# Patient Record
Sex: Female | Born: 2000 | Hispanic: No | Marital: Single | Smoking: Never smoker
Health system: Southern US, Community
[De-identification: ages and names within clinical notes are randomized; demographics above are authoritative.]

## PROBLEM LIST (undated history)

## (undated) HISTORY — PX: WISDOM TOOTH EXTRACTION: SHX21

## (undated) HISTORY — PX: ANTERIOR CRUCIATE LIGAMENT REPAIR: SHX115

---

## 2018-10-11 ENCOUNTER — Other Ambulatory Visit: Payer: Self-pay

## 2018-10-11 ENCOUNTER — Emergency Department (HOSPITAL_COMMUNITY)
Admission: EM | Admit: 2018-10-11 | Discharge: 2018-10-12 | Disposition: A | Discharge status: Home / Self Care | Payer: BC Managed Care – PPO | Attending: Emergency Medicine | Admitting: Emergency Medicine

## 2018-10-11 DIAGNOSIS — M546 Pain in thoracic spine: Secondary | ICD-10-CM | POA: Insufficient documentation

## 2018-10-11 DIAGNOSIS — M7918 Myalgia, other site: Secondary | ICD-10-CM

## 2018-10-12 ENCOUNTER — Other Ambulatory Visit: Payer: Self-pay

## 2018-10-12 MED ORDER — IBUPROFEN 400 MG PO TABS
600.0000 mg | ORAL_TABLET | Freq: Once | ORAL | Status: AC
  Administered 2018-10-12: 600 mg via ORAL
  Filled 2018-10-12: qty 1

## 2018-10-12 MED ORDER — CYCLOBENZAPRINE HCL 10 MG PO TABS: 10.0000 mg | ORAL_TABLET | Freq: Every day | ORAL | 0 refills | Status: AC

## 2018-10-12 MED ORDER — IBUPROFEN 600 MG PO TABS: 600.0000 mg | ORAL_TABLET | Freq: Four times a day (QID) | ORAL | 0 refills | Status: DC | PRN
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
## 2018-10-12 NOTE — ED Notes (Signed)
Pt alert and oriented at this time.  Pt a one touch pt, see provider's assessment.
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
## 2018-10-13 ENCOUNTER — Inpatient Hospital Stay (HOSPITAL_COMMUNITY): Payer: BC Managed Care – PPO

## 2018-10-13 ENCOUNTER — Encounter (HOSPITAL_COMMUNITY): Payer: Self-pay | Admitting: Emergency Medicine

## 2018-10-13 ENCOUNTER — Other Ambulatory Visit: Payer: Self-pay

## 2018-10-13 ENCOUNTER — Inpatient Hospital Stay (HOSPITAL_COMMUNITY)
Admission: AD | Admit: 2018-10-13 | Discharge: 2018-10-13 | Disposition: A | Discharge status: Home / Self Care | Payer: BC Managed Care – PPO | Attending: Family Medicine | Admitting: Family Medicine

## 2018-10-13 DIAGNOSIS — Z3A01 Less than 8 weeks gestation of pregnancy: Secondary | ICD-10-CM | POA: Insufficient documentation

## 2018-10-13 DIAGNOSIS — O26891 Other specified pregnancy related conditions, first trimester: Secondary | ICD-10-CM | POA: Diagnosis not present

## 2018-10-13 DIAGNOSIS — O3680X0 Pregnancy with inconclusive fetal viability, not applicable or unspecified: Secondary | ICD-10-CM

## 2018-10-13 DIAGNOSIS — R1030 Lower abdominal pain, unspecified: Secondary | ICD-10-CM | POA: Diagnosis present

## 2018-10-13 DIAGNOSIS — Z3490 Encounter for supervision of normal pregnancy, unspecified, unspecified trimester: Secondary | ICD-10-CM

## 2018-10-13 DIAGNOSIS — Z882 Allergy status to sulfonamides status: Secondary | ICD-10-CM | POA: Diagnosis not present

## 2018-10-13 DIAGNOSIS — O26899 Other specified pregnancy related conditions, unspecified trimester: Secondary | ICD-10-CM

## 2018-10-13 DIAGNOSIS — R109 Unspecified abdominal pain: Secondary | ICD-10-CM | POA: Diagnosis not present

## 2018-10-13 DIAGNOSIS — N898 Other specified noninflammatory disorders of vagina: Secondary | ICD-10-CM | POA: Insufficient documentation

## 2018-10-13 DIAGNOSIS — O9989 Other specified diseases and conditions complicating pregnancy, childbirth and the puerperium: Secondary | ICD-10-CM | POA: Diagnosis not present

## 2018-10-13 DIAGNOSIS — Z885 Allergy status to narcotic agent status: Secondary | ICD-10-CM | POA: Diagnosis not present

## 2018-10-13 DIAGNOSIS — Z79899 Other long term (current) drug therapy: Secondary | ICD-10-CM | POA: Diagnosis not present

## 2018-10-13 DIAGNOSIS — M7918 Myalgia, other site: Secondary | ICD-10-CM

## 2018-10-13 DIAGNOSIS — Z833 Family history of diabetes mellitus: Secondary | ICD-10-CM | POA: Diagnosis not present

## 2018-10-13 LAB — CBC
HCT: 42.8 % (ref 36.0–46.0)
Hemoglobin: 13.8 g/dL (ref 12.0–15.0)
MCH: 28.8 pg (ref 26.0–34.0)
MCHC: 32.2 g/dL (ref 30.0–36.0)
MCV: 89.4 fL (ref 80.0–100.0)
Platelets: 229 10*3/uL (ref 150–400)
RBC: 4.79 MIL/uL (ref 3.87–5.11)
RDW: 11.2 % — ABNORMAL LOW (ref 11.5–15.5)
WBC: 8.8 10*3/uL (ref 4.0–10.5)
nRBC: 0 % (ref 0.0–0.2)

## 2018-10-13 LAB — COMPREHENSIVE METABOLIC PANEL
ALT: 15 U/L (ref 0–44)
AST: 18 U/L (ref 15–41)
Albumin: 4.3 g/dL (ref 3.5–5.0)
Alkaline Phosphatase: 43 U/L (ref 38–126)
Anion gap: 11 (ref 5–15)
BUN: 15 mg/dL (ref 6–20)
CO2: 19 mmol/L — ABNORMAL LOW (ref 22–32)
Calcium: 9.5 mg/dL (ref 8.9–10.3)
Chloride: 107 mmol/L (ref 98–111)
Creatinine, Ser: 0.97 mg/dL (ref 0.44–1.00)
GFR calc Af Amer: >60 mL/min (ref >60)
GFR calc non Af Amer: >60 mL/min (ref >60)
Glucose, Bld: 99 mg/dL (ref 70–99)
Potassium: 4.5 mmol/L (ref 3.5–5.1)
Sodium: 137 mmol/L (ref 135–145)
Total Bilirubin: 0.9 mg/dL (ref 0.3–1.2)
Total Protein: 6.8 g/dL (ref 6.5–8.1)

## 2018-10-13 LAB — WET PREP, GENITAL
Clue Cells Wet Prep HPF POC: NONE SEEN
Sperm: NONE SEEN
Trich, Wet Prep: NONE SEEN
Yeast Wet Prep HPF POC: NONE SEEN

## 2018-10-13 LAB — URINALYSIS, ROUTINE W REFLEX MICROSCOPIC
Bilirubin Urine: NEGATIVE
Glucose, UA: NEGATIVE mg/dL
Hgb urine dipstick: NEGATIVE
Ketones, ur: 20 mg/dL — AB
Leukocytes,Ua: NEGATIVE
Nitrite: NEGATIVE
Protein, ur: NEGATIVE mg/dL
Specific Gravity, Urine: 1.023 (ref 1.005–1.030)
pH: 5 (ref 5.0–8.0)

## 2018-10-13 LAB — HCG, QUANTITATIVE, PREGNANCY: hCG, Beta Chain, Quant, S: 45 m[IU]/mL — ABNORMAL HIGH (ref <5)

## 2018-10-13 LAB — LIPASE, BLOOD: Lipase: 24 U/L (ref 11–51)

## 2018-10-13 LAB — I-STAT BETA HCG BLOOD, ED (MC, WL, AP ONLY): I-stat hCG, quantitative: 42.6 m[IU]/mL — ABNORMAL HIGH (ref <5)

## 2018-10-13 MED ORDER — SODIUM CHLORIDE 0.9% FLUSH: 3.0000 mL | Freq: Once | INTRAVENOUS | Status: DC

## 2018-10-13 NOTE — Discharge Instructions (Signed)
Abdominal Pain During Pregnancy ° °Belly (abdominal) pain is common during pregnancy. There are many possible causes. Most of the time, it is not a serious problem. Other times, it can be a sign that something is wrong with the pregnancy. Always tell your doctor if you have belly pain. °Follow these instructions at home: °· Do not have sex or put anything in your vagina until your pain goes away completely. °· Get plenty of rest until your pain gets better. °· Drink enough fluid to keep your pee (urine) pale yellow. °· Take over-the-counter and prescription medicines only as told by your doctor. °· Keep all follow-up visits as told by your doctor. This is important. °Contact a doctor if: °· Your pain continues or gets worse after resting. °· You have lower belly pain that: °? Comes and goes at regular times. °? Spreads to your back. °? Feels like menstrual cramps. °· You have pain or burning when you pee (urinate). °Get help right away if: °· You have a fever or chills. °· You have vaginal bleeding. °· You are leaking fluid from your vagina. °· You are passing tissue from your vagina. °· You throw up (vomit) for more than 24 hours. °· You have watery poop (diarrhea) for more than 24 hours. °· Your baby is moving less than usual. °· You feel very weak or faint. °· You have shortness of breath. °· You have very bad pain in your upper belly. °Summary °· Belly (abdominal) pain is common during pregnancy. There are many possible causes. °· If you have belly pain during pregnancy, tell your doctor right away. °· Keep all follow-up visits as told by your doctor. This is important. °This information is not intended to replace advice given to you by your health care provider. Make sure you discuss any questions you have with your health care provider. °Document Released: 01/16/2009 Document Revised: 05/18/2018 Document Reviewed: 05/02/2016 °Elsevier Patient Education © 2020 [employer] ° °
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
## 2018-10-13 NOTE — MAU Provider Note (Signed)
History     CSN: [id number]  Arrival date and time: 10/13/18 1645   First Provider Initiated Contact with Patient 10/13/18 1801      Chief Complaint  Patient presents with  . Abdominal Pain   18 y.o. G1 @[redacted]w[redacted]d  by sure LMP presenting with low abd cramping. Sx started yesterday. Pain is bilateral and intermittent. Rates 6/10. She took Ibuprofen earlier which didn't help much. Denies urinary sx. Denies VB or discharge today but saw brown on the toilet paper a few days ago. No GI sx.   OB History    Gravida  1   Para      Term      Preterm      AB      Living        SAB      TAB      Ectopic      Multiple      Live Births              History reviewed. No pertinent past medical history.  Past Surgical History:  Procedure Laterality Date  . ANTERIOR CRUCIATE LIGAMENT REPAIR    . WISDOM TOOTH EXTRACTION      Family History  Problem Relation Age of Onset  . Diabetes Maternal Grandmother   . Diabetes Paternal Grandmother     Social History   Tobacco Use  . Smoking status: Never Smoker  . Smokeless tobacco: Never Used  Substance Use Topics  . Alcohol use: Not Currently  . Drug use: Never    Allergies:  Allergies  Allergen Reactions  . Codeine   . Sulfa Antibiotics     No medications prior to admission.    Review of Systems  Constitutional: Negative for fever.  Gastrointestinal: Positive for abdominal pain. Negative for constipation, diarrhea, nausea and vomiting.  Genitourinary: Negative for dysuria, frequency, urgency, vaginal bleeding and vaginal discharge.   Physical Exam   Blood pressure (!) 122/51, pulse 88, temperature 97.8 F (36.6 C), resp. rate 16, last menstrual period 09/20/2018, SpO2 100 %.  Physical Exam  Nursing note and vitals reviewed. Constitutional: She is oriented to person, place, and time. She appears well-developed and well-nourished. No distress.  HENT:  Head: Normocephalic and atraumatic.  Neck: Normal range of  motion.  Cardiovascular: Normal rate.  Respiratory: Effort normal. No respiratory distress.  GI: Soft. She exhibits no distension and no mass. There is no abdominal tenderness. There is no rebound and no guarding.  Genitourinary:    Genitourinary Comments: External: no lesions or erythema Vagina: rugated, pink, moist, thick adherent curdy yellow discharge Uterus: non enlarged, anteverted, non tender, no CMT Adnexae: no masses, no tenderness left, no tenderness right Cervix closed, normal    Musculoskeletal: Normal range of motion.  Neurological: She is alert and oriented to person, place, and time.  Skin: Skin is warm and dry.  Psychiatric: She has a normal mood and affect.   Results for orders placed or performed during the hospital encounter of 10/13/18 (from the past 24 hour(s))  Lipase, blood     Status: None   Collection Time: 10/13/18  1:39 PM  Result Value Ref Range   Lipase 24 11 - 51 U/L  Comprehensive metabolic panel     Status: Abnormal   Collection Time: 10/13/18  1:39 PM  Result Value Ref Range   Sodium 137 135 - 145 mmol/L   Potassium 4.5 3.5 - 5.1 mmol/L   Chloride 107 98 - 111 mmol/L  CO2 19 (L) 22 - 32 mmol/L   Glucose, Bld 99 70 - 99 mg/dL   BUN 15 6 - 20 mg/dL   Creatinine, Ser 0.97 0.44 - 1.00 mg/dL   Calcium 9.5 8.9 - 10.3 mg/dL   Total Protein 6.8 6.5 - 8.1 g/dL   Albumin 4.3 3.5 - 5.0 g/dL   AST 18 15 - 41 U/L   ALT 15 0 - 44 U/L   Alkaline Phosphatase 43 38 - 126 U/L   Total Bilirubin 0.9 0.3 - 1.2 mg/dL   GFR calc non Af Amer >60 >60 mL/min   GFR calc Af Amer >60 >60 mL/min   Anion gap 11 5 - 15  CBC     Status: Abnormal   Collection Time: 10/13/18  1:39 PM  Result Value Ref Range   WBC 8.8 4.0 - 10.5 K/uL   RBC 4.79 3.87 - 5.11 MIL/uL   Hemoglobin 13.8 12.0 - 15.0 g/dL   HCT 42.8 36.0 - 46.0 %   MCV 89.4 80.0 - 100.0 fL   MCH 28.8 26.0 - 34.0 pg   MCHC 32.2 30.0 - 36.0 g/dL   RDW 11.2 (L) 11.5 - 15.5 %   Platelets 229 150 - 400 K/uL    nRBC 0.0 0.0 - 0.2 %  I-Stat beta hCG blood, ED     Status: Abnormal   Collection Time: 10/13/18  1:47 PM  Result Value Ref Range   I-stat hCG, quantitative 42.6 (H) <5 mIU/mL   Comment 3          Urinalysis, Routine w reflex microscopic     Status: Abnormal   Collection Time: 10/13/18  5:30 PM  Result Value Ref Range   Color, Urine YELLOW YELLOW   APPearance CLEAR CLEAR   Specific Gravity, Urine 1.023 1.005 - 1.030   pH 5.0 5.0 - 8.0   Glucose, UA NEGATIVE NEGATIVE mg/dL   Hgb urine dipstick NEGATIVE NEGATIVE   Bilirubin Urine NEGATIVE NEGATIVE   Ketones, ur 20 (A) NEGATIVE mg/dL   Protein, ur NEGATIVE NEGATIVE mg/dL   Nitrite NEGATIVE NEGATIVE   Leukocytes,Ua NEGATIVE NEGATIVE  hCG, quantitative, pregnancy     Status: Abnormal   Collection Time: 10/13/18  5:48 PM  Result Value Ref Range   hCG, Beta Chain, Quant, S 45 (H) <5 mIU/mL  Wet prep, genital     Status: Abnormal   Collection Time: 10/13/18  6:16 PM   Specimen: Cervical/Vaginal swab  Result Value Ref Range   Yeast Wet Prep HPF POC NONE SEEN NONE SEEN   Trich, Wet Prep NONE SEEN NONE SEEN   Clue Cells Wet Prep HPF POC NONE SEEN NONE SEEN   WBC, Wet Prep HPF POC FEW (A) NONE SEEN   Sperm NONE SEEN    US Ob Less Than 14 Weeks With Ob Transvaginal  Result Date: 10/13/2018 CLINICAL DATA:  Initial evaluation for acute abdominal pain, early pregnancy. EXAM: OBSTETRIC <14 WK [place] AND TRANSVAGINAL OB US TECHNIQUE: Both transabdominal and transvaginal ultrasound examinations were performed for complete evaluation of the gestation as well as the maternal uterus, adnexal regions, and pelvic cul-de-sac. Transvaginal technique was performed to assess early pregnancy. COMPARISON:  None available. FINDINGS: Intrauterine gestational sac: Negative. Endometrial stripe thickened up to 14.4 mm. Yolk sac:  Negative. Embryo:  Negative. Cardiac Activity: Negative. Heart Rate: N/A  bpm Subchorionic hemorrhage:  None visualized. Maternal  uterus/adnexae: Left ovary only seen transabdominally, and is normal in appearance. No left adnexal mass. Small degenerating  corpus luteal cyst noted within the right ovary. Associated small volume free fluid within the pelvis. IMPRESSION: 1. Early pregnancy with no discrete IUP or adnexal mass identified. Finding is consistent with a pregnancy of unknown anatomic location. Differential considerations include IUP to early to visualize, recent SAB, or possibly occult ectopic pregnancy. Close clinical monitoring with serial beta HCGs and close interval follow-up ultrasound recommended as clinically warranted. 2. Degenerating right ovarian corpus luteal cyst with associated small volume free fluid within the pelvis. 3. No other acute maternal uterine or adnexal abnormality identified. Electronically Signed   By: Rise Mu M.D.   On: 10/13/2018 18:46   MAU Course  Procedures  MDM Labs and [place] ordered and reviewed. Very early stage of pregnancy, location cannot be determined by [place]. Will follow qhcg. Pain likely MSK, discussed comfort measures. Stable for discharge home.   Assessment and Plan   1. Abdominal pain in pregnancy   2. Early stage of pregnancy   3. Generalized muscular abdominal pain   4. Pregnancy, location unknown    Discharge home [facility] at [facility] on 10/16/18 @0900  Ectopic/SAB return precautions Tylenol prn Start PNV  Allergies as of 10/13/2018      Reactions   Codeine    Sulfa Antibiotics       Medication List    STOP taking these medications   ibuprofen 600 MG tablet Commonly known as: ADVIL     TAKE these medications   cyclobenzaprine 10 MG tablet Commonly known as: FLEXERIL Take 1 tablet (10 mg total) by mouth at bedtime.      Donette Larry, CNM 10/13/2018, 7:21 PM
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
## 2018-10-15 ENCOUNTER — Telehealth: Payer: Self-pay | Admitting: Family Medicine

## 2018-10-15 LAB — GC/CHLAMYDIA PROBE AMP (~~LOC~~) NOT AT ARMC
Chlamydia: NEGATIVE
Neisseria Gonorrhea: NEGATIVE

## 2018-10-15 NOTE — Telephone Encounter (Signed)
Spoke to patient about her appointment on 9/4 @ 9:00. Patient instructed to wear a face mask for the entire appointment and no visitors are allowed with her during the visit. Patient screened for covid symptoms and denied having any

## 2018-10-16 ENCOUNTER — Other Ambulatory Visit: Payer: Self-pay

## 2018-10-16 ENCOUNTER — Ambulatory Visit (INDEPENDENT_AMBULATORY_CARE_PROVIDER_SITE_OTHER): Payer: BC Managed Care – PPO | Admitting: Lactation Services

## 2018-10-16 DIAGNOSIS — Z3490 Encounter for supervision of normal pregnancy, unspecified, unspecified trimester: Secondary | ICD-10-CM

## 2018-10-16 DIAGNOSIS — O3680X0 Pregnancy with inconclusive fetal viability, not applicable or unspecified: Secondary | ICD-10-CM

## 2018-10-16 LAB — BETA HCG QUANT (REF LAB): hCG Quant: 135 m[IU]/mL

## 2018-10-16 MED ORDER — PRENATAL PLUS 27-1 MG PO TABS: 1.0000 | ORAL_TABLET | Freq: Every day | ORAL | 11 refills | Status: AC

## 2018-10-16 NOTE — Progress Notes (Addendum)
Pt here for follow up Beta Hcg.   Pt with cramping occasionally and feels better after going to the bathroom. Pt denies bleeding.   PNV Vitamins ordered and sent to pt pharmacy.   Pt informed that results will be back in a few hours and we will call her with results and follow up.   11: 25 received HCG level of 135. Spoke with Kerry Hough, PA and plan was to have [place] follow up in 10 days. [place] scheduled for 9/21 at 09:45. Pt needs to arrive at 9:45 with full bladder.   Called pt at 11:35 am to inform her to come in for [place] on 9/21 for [place] with full bladder. Pt voiced understanding.   Pt wanted to know her Due date in. LPM August 9 EDD 06/27/2019.

## 2018-10-16 NOTE — Addendum Note (Signed)
Addended by: Donn Pierini on: 10/16/2018 11:38 AM   Modules accepted: Orders

## 2018-10-21 ENCOUNTER — Telehealth: Payer: Self-pay | Admitting: Obstetrics & Gynecology

## 2018-10-21 ENCOUNTER — Telehealth: Payer: Self-pay | Admitting: *Deleted

## 2018-10-21 ENCOUNTER — Telehealth: Payer: Self-pay | Admitting: Advanced Practice Midwife

## 2018-10-21 NOTE — Telephone Encounter (Signed)
Called patient regarding appointment and the following message was left: ° ° °We have you scheduled for an upcoming appointment at our office. At this time, patients are encouraged to come alone to their visits whenever possible, however, a support person, over age 20, may accompany you to your appointment if assistance is needed for safety or care concerns. Otherwise, support persons should remain outside until the visit is complete.  ° °We ask if you have had any exposure to anyone suspected or confirmed of having COVID-19 or if you are experiencing any of the following, to call and reschedule your appointment: fever, cough, shortness of breath, muscle pain, diarrhea, rash, vomiting, abdominal pain, red eye, weakness, bruising, bleeding, joint pain, or a severe headache.  ° °Please know we will ask you these questions or similar questions when you arrive for your appointment and again it’s how we are keeping everyone safe.   ° °Also,to keep you safe, please use the provided hand sanitizer when you enter the office. We are asking everyone in the office to wear a mask to help prevent the spread of °germs. If you have a mask of your own, please wear it to your appointment, if not, we are happy to provide one for you. ° °Thank you for understanding and your cooperation.  ° ° °[facility] Staff ° ° ° ° ° °

## 2018-10-21 NOTE — Telephone Encounter (Signed)
Called patient regarding appointment scheduled in our office encouraged to come alone to the visit if possible, however, a support person, over age 20, may accompany her  to appointment if assistance is needed for safety or care concerns. Otherwise, support persons should remain outside until the visit is complete.  ° °We ask if you have had any exposure to anyone suspected or confirmed of having COVID-19 or if you are experiencing any of the following, to call and reschedule your appointment: fever, cough, shortness of breath, muscle pain, diarrhea, rash, vomiting, abdominal pain, red eye, weakness, bruising, bleeding, joint pain, or a severe headache.  ° °Please know we will ask you these questions or similar questions when you arrive for your appointment and again it’s how we are keeping everyone safe.   ° °Also,to keep you safe, please use the provided hand sanitizer when you enter the office. We are asking everyone in the office to wear a mask to help prevent the spread of °germs. If you have a mask of your own, please wear it to your appointment, if not, we are happy to provide one for you. ° °Thank you for understanding and your cooperation.  ° ° °[facility] Staff ° ° ° °

## 2018-10-21 NOTE — Telephone Encounter (Signed)
Called patient to speak with her regarding her appointment scheduled for tomorrow.  Patient states she is not currently having any cramping.  Informed patient that since she is already scheduled for an U/S on 9/21 and she was not having any problems, she did not need to be seen. Advised she could keep her u/s appt as scheduled at the hospital since she lives in [place] as we did not have any appts available before then. If after her appointment she wanted to transfer to our office, that would be fine. Pt verbalized understanding and agreeable to plan.    Mother called 10 minutes after my discussion with the patient stating she had made this appointment for her daughter to see Dr Elonda Husky because he delivered her and her sister in [place] in 1998. She did not appreciate the fact that the appointment was cancelled as she wanted to get his opinion and to see him as it has been several years since she had seen him. She also stated her daughter was very upset after my conversation with her.  I informed her that we are not allowing visitors to appointments at this time so she would not be able to see him but also it was not medically necessary for daughter to be seen. I also made her aware that after my discussion with the patient, she had no further questions and was fine at the end of our conversation.  I also informed her I could not discuss her daughter's information further as there was not a release on file for me to do so. Levada Dy continued to discuss her daughters need to be seen.  I informed her I would discuss with Dr Elonda Husky and he could decide if she needed to be seen.  Mother stated that was fine.   1530: I called patient back to let her know that her mother had called regarding the cancellation of her appointment and was not happy. I informed the patient that I could not discuss her care with her mother since there was not a release on file but that I had talked with Dr Elonda Husky regarding the need for her to be  seen. He stated medically she did not need to be seen but she could still keep her appt but have an HCG drawn prior to her appointment.  Patient stated she understood and did not have any other questions.

## 2018-10-22 ENCOUNTER — Encounter: Payer: Self-pay | Admitting: Obstetrics & Gynecology

## 2018-10-22 ENCOUNTER — Other Ambulatory Visit: Payer: Self-pay

## 2018-10-22 ENCOUNTER — Encounter: Payer: BC Managed Care – PPO | Admitting: Obstetrics & Gynecology

## 2018-10-22 ENCOUNTER — Ambulatory Visit (INDEPENDENT_AMBULATORY_CARE_PROVIDER_SITE_OTHER): Payer: BC Managed Care – PPO | Admitting: Obstetrics & Gynecology

## 2018-10-22 ENCOUNTER — Other Ambulatory Visit: Payer: BC Managed Care – PPO

## 2018-10-22 VITALS — BP 125/79 | HR 80 | Ht 64.0 in | Wt 158.0 lb

## 2018-10-22 DIAGNOSIS — Z3490 Encounter for supervision of normal pregnancy, unspecified, unspecified trimester: Secondary | ICD-10-CM

## 2018-10-22 DIAGNOSIS — Z3A01 Less than 8 weeks gestation of pregnancy: Secondary | ICD-10-CM

## 2018-10-22 DIAGNOSIS — O3680X0 Pregnancy with inconclusive fetal viability, not applicable or unspecified: Secondary | ICD-10-CM

## 2018-10-22 DIAGNOSIS — Z331 Pregnant state, incidental: Secondary | ICD-10-CM

## 2018-10-22 DIAGNOSIS — Z1389 Encounter for screening for other disorder: Secondary | ICD-10-CM

## 2018-10-22 LAB — POCT URINALYSIS DIPSTICK OB
Blood, UA: NEGATIVE
Glucose, UA: NEGATIVE
Ketones, UA: NEGATIVE
Leukocytes, UA: NEGATIVE
Nitrite, UA: NEGATIVE
POC,PROTEIN,UA: NEGATIVE

## 2018-10-22 NOTE — Progress Notes (Signed)
Chief Complaint  Patient presents with  . Abdominal Pain    4 weeks pregnancy/ serum hcg done      18 y.o. G1P0 Patient's last menstrual period was 09/20/2018. The current method of family planning is none.  Outpatient Encounter Medications as of 10/22/2018  Medication Sig  . prenatal vitamin w/FE, FA (PRENATAL 1 + 1) 27-1 MG TABS tablet Take 1 tablet by mouth daily at 12 noon.  . cyclobenzaprine (FLEXERIL) 10 MG tablet Take 1 tablet (10 mg total) by mouth at bedtime. (Patient not taking: Reported on 10/22/2018)   No facility-administered encounter medications on file as of 10/22/2018.     Subjective Early pregnant with serial HCG being done No bleeding or cramping Sonogram normal based on HCG level, repeat 9/21 History reviewed. No pertinent past medical history.  Past Surgical History:  Procedure Laterality Date  . ANTERIOR CRUCIATE LIGAMENT REPAIR    . WISDOM TOOTH EXTRACTION      OB History    Gravida  1   Para      Term      Preterm      AB      Living        SAB      TAB      Ectopic      Multiple      Live Births              Allergies  Allergen Reactions  . Codeine   . Sulfa Antibiotics     Social History   Socioeconomic History  . Marital status: Single    Spouse name: Not on file  . Number of children: 0  . Years of education: Not on file  . Highest education level: Not on file  Occupational History  . Not on file  Social Needs  . Financial resource strain: Not on file  . Food insecurity    Worry: Not on file    Inability: Not on file  . Transportation needs    Medical: Not on file    Non-medical: Not on file  Tobacco Use  . Smoking status: Never Smoker  . Smokeless tobacco: Never Used  Substance and Sexual Activity  . Alcohol use: Not Currently  . Drug use: Never  . Sexual activity: Yes  Lifestyle  . Physical activity    Days per week: Not on file    Minutes per session: Not on file  . Stress: Not on file   Relationships  . Social Herbalist on phone: Not on file    Gets together: Not on file    Attends religious service: Not on file    Active member of club or organization: Not on file    Attends meetings of clubs or organizations: Not on file    Relationship status: Not on file  Other Topics Concern  . Not on file  Social History Narrative  . Not on file    Family History  Problem Relation Age of Onset  . Diabetes Maternal Grandmother   . Diabetes Paternal Grandmother     Medications:       Current Outpatient Medications:  .  prenatal vitamin w/FE, FA (PRENATAL 1 + 1) 27-1 MG TABS tablet, Take 1 tablet by mouth daily at 12 noon., Disp: 30 tablet, Rfl: 11 .  cyclobenzaprine (FLEXERIL) 10 MG tablet, Take 1 tablet (10 mg total) by mouth at bedtime. (Patient not taking: Reported on 10/22/2018), Disp:  8 tablet, Rfl: 0  Objective Blood pressure 125/79, pulse 80, height 5\' 4"  (1.626 m), weight 158 lb (71.7 kg), last menstrual period 09/20/2018.  Gen WDWN NAD  Pertinent ROS No burning with urination, frequency or urgency No nausea, vomiting or diarrhea Nor fever chills or other constitutional symptoms   Labs or studies Pending HCG level    Impression Diagnoses this Encounter::   ICD-10-CM   1. Early stage of pregnancy  Z34.90   2. Pregnant state, incidental  Z33.1 POC Urinalysis Dipstick OB  3. Screening for genitourinary condition  Z13.89 POC Urinalysis Dipstick OB    Established relevant diagnosis(es):   Plan/Recommendations: No orders of the defined types were placed in this encounter.   Labs or Scans Ordered: Orders Placed This Encounter  Procedures  . POC Urinalysis Dipstick OB    Management:: Serial HCG measurments with sonogram 9/21, will inform pt of her values  Follow up Return for as scheduled.        Face to face time:  15 minutes  Greater than 50% of the visit time was spent in counseling and coordination of care with the  patient.  The summary and outline of the counseling and care coordination is summarized in the note above.   All questions were answered.

## 2018-10-23 LAB — BETA HCG QUANT (REF LAB): hCG Quant: 1789 m[IU]/mL

## 2018-10-26 ENCOUNTER — Telehealth: Payer: Self-pay | Admitting: Lactation Services

## 2018-10-26 NOTE — Telephone Encounter (Signed)
Pt has appt on 9/21 for [place], she would like to know if her partner is allowed to come with her.

## 2018-10-29 NOTE — Telephone Encounter (Signed)
I called Senai back and informed her that due to covid restrictions she is not allowed to have anyone with her for her [place] appointment or results appointment. She voices understanding.  Linda,RN

## 2018-11-02 ENCOUNTER — Ambulatory Visit (HOSPITAL_COMMUNITY)
Admission: RE | Admit: 2018-11-02 | Discharge: 2018-11-02 | Disposition: A | Discharge status: Home / Self Care | Payer: BC Managed Care – PPO | Source: Ambulatory Visit | Attending: Medical | Admitting: Medical

## 2018-11-02 ENCOUNTER — Ambulatory Visit (HOSPITAL_COMMUNITY): Admission: RE | Admit: 2018-11-02 | Payer: BC Managed Care – PPO | Source: Ambulatory Visit

## 2018-11-02 ENCOUNTER — Ambulatory Visit (INDEPENDENT_AMBULATORY_CARE_PROVIDER_SITE_OTHER): Payer: BC Managed Care – PPO

## 2018-11-02 ENCOUNTER — Ambulatory Visit: Payer: BC Managed Care – PPO

## 2018-11-02 ENCOUNTER — Encounter: Payer: Self-pay | Admitting: Family Medicine

## 2018-11-02 ENCOUNTER — Other Ambulatory Visit: Payer: Self-pay

## 2018-11-02 DIAGNOSIS — O3680X0 Pregnancy with inconclusive fetal viability, not applicable or unspecified: Secondary | ICD-10-CM | POA: Diagnosis present

## 2018-11-02 DIAGNOSIS — Z3490 Encounter for supervision of normal pregnancy, unspecified, unspecified trimester: Secondary | ICD-10-CM | POA: Diagnosis not present

## 2018-11-02 NOTE — Progress Notes (Signed)
Pt here today for OB US results.  Notified Dr. Ilda Basset of results no new orders.  Informed pt of viable pregnancy with EDD 06/27/18 and 6w 1d today.  Medications reconciled. List of medications safe to take in pregnancy given to pt.  Ong office to provide proof of pregnancy letter.    Mel Almond, RN 11/02/18

## 2018-11-04 NOTE — Progress Notes (Signed)
Patient seen and assessed by nursing staff during this encounter. I have reviewed the chart and agree with the documentation and plan.  Miro Balderson, MD 11/04/2018 9:11 AM    

## 2021-04-29 IMAGING — US US OB < 14 WEEKS - US OB TV
1 series · 15 of 28 positions shown · non-contrast
Comparison: None available.

CLINICAL DATA: Initial evaluation for acute abdominal pain, early
pregnancy.

EXAM:
OBSTETRIC <14 WK US AND TRANSVAGINAL OB US
TECHNIQUE: Both transabdominal and transvaginal ultrasound examinations were
performed for complete evaluation of the gestation as well as the
maternal uterus, adnexal regions, and pelvic cul-de-sac.
Transvaginal technique was performed to assess early pregnancy.

[Series 1: us ob < 14 weeks - us ob tv · 15 of 45 slices shown]
[im 1/45]
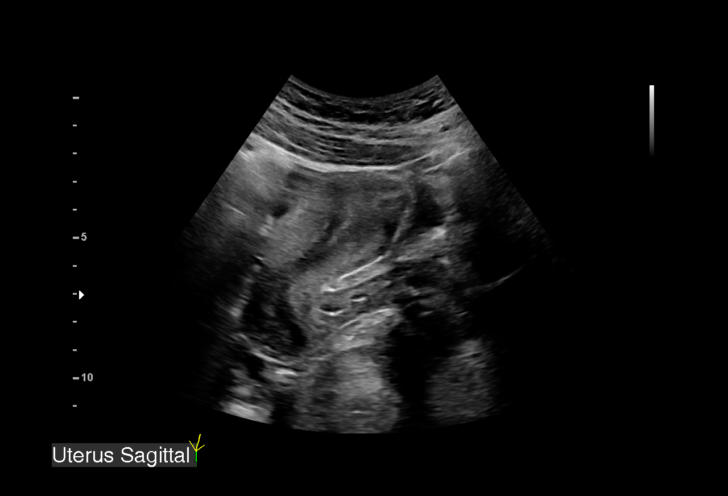
[im 4/45]
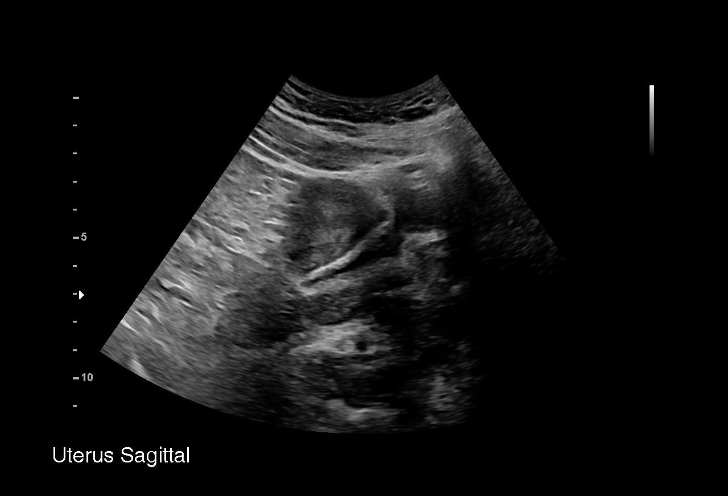
[im 7/45]
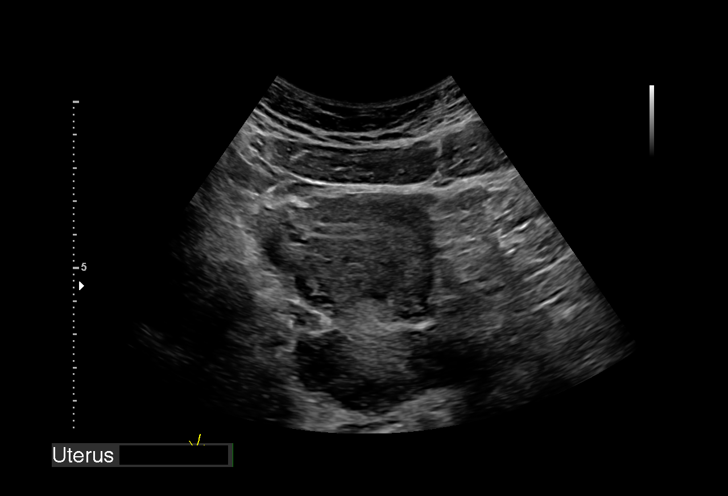
[im 10/45]
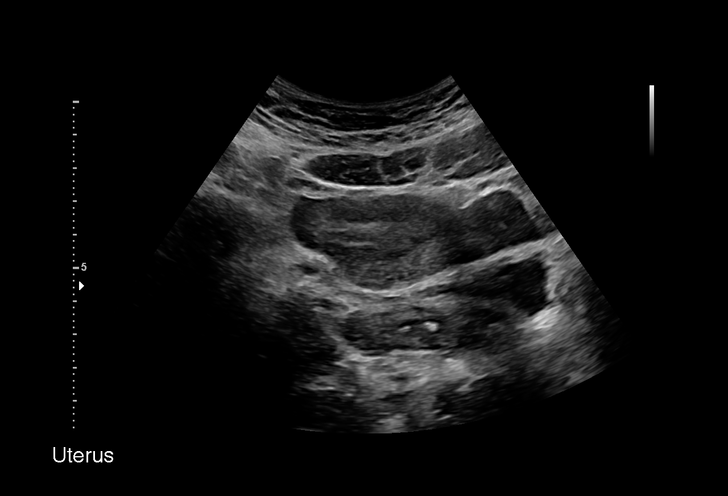
[im 14/45]
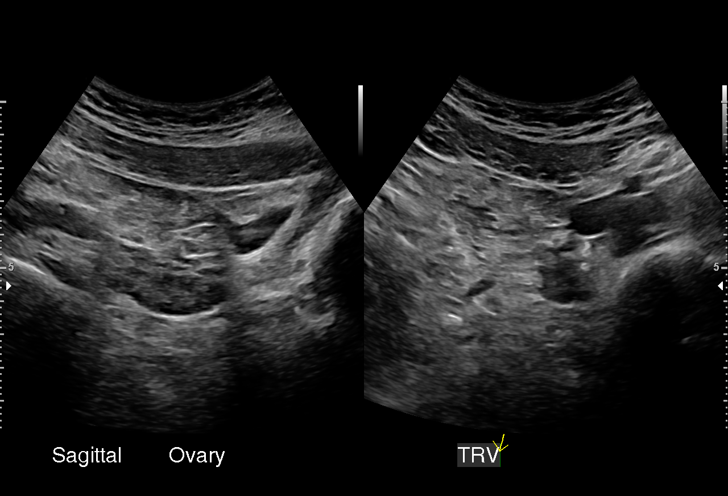
[im 17/45]
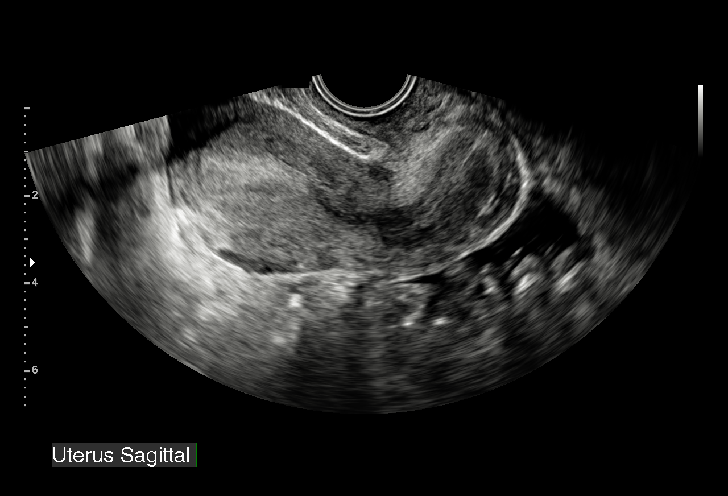
[im 20/45]
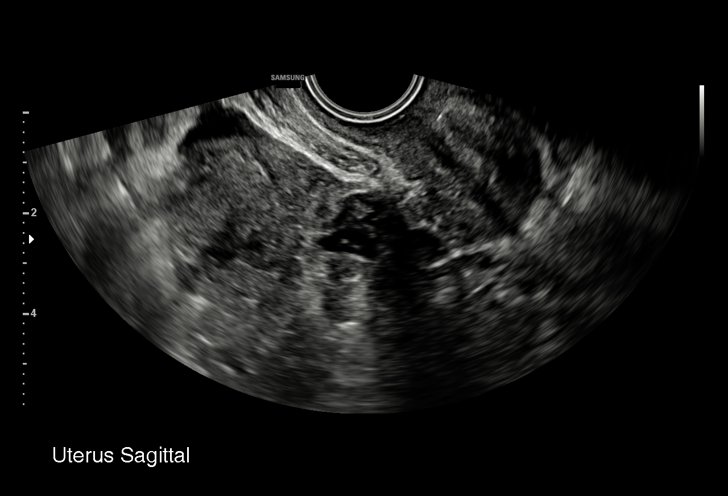
[im 23/45]
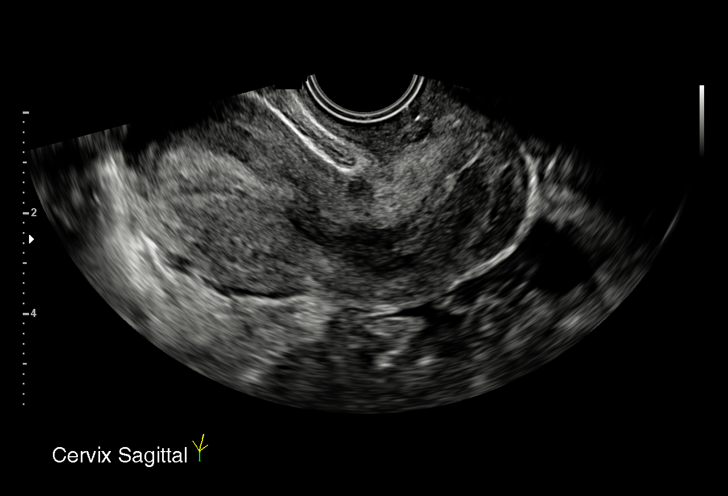
[im 25/45]
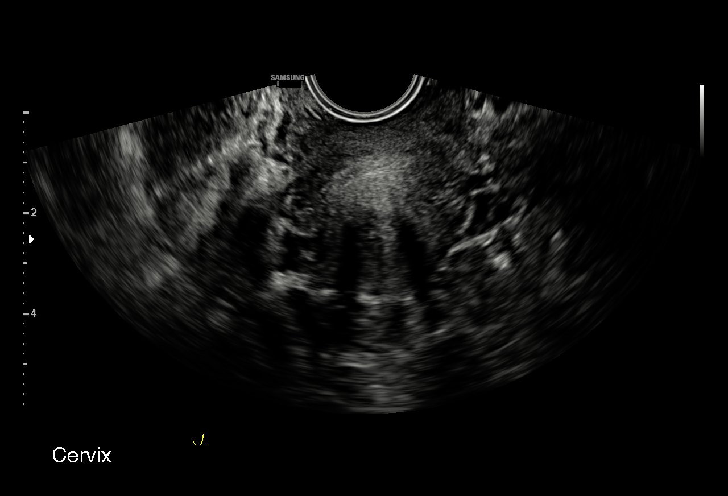
[im 28/45]
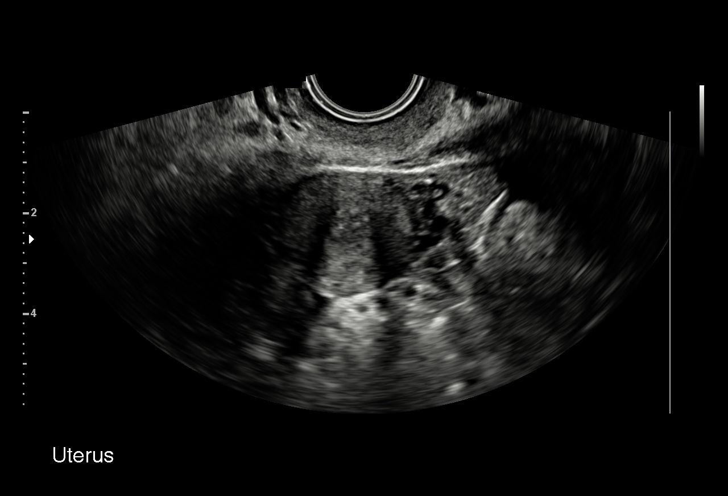
[im 31/45]
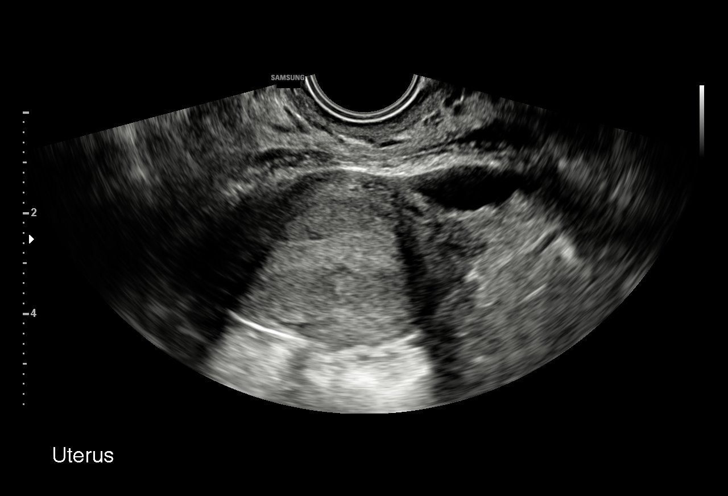
[im 35/45]
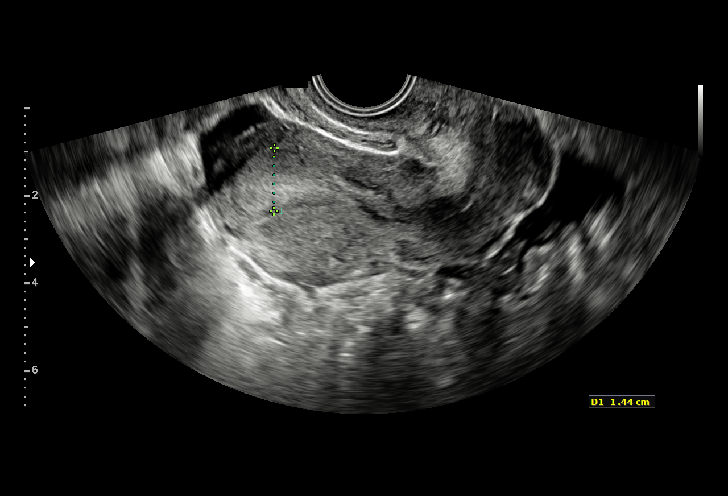
[im 38/45]
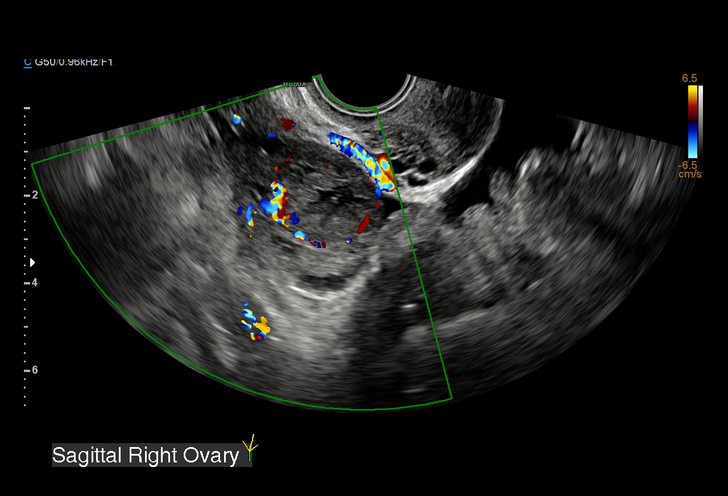
[im 41/45]
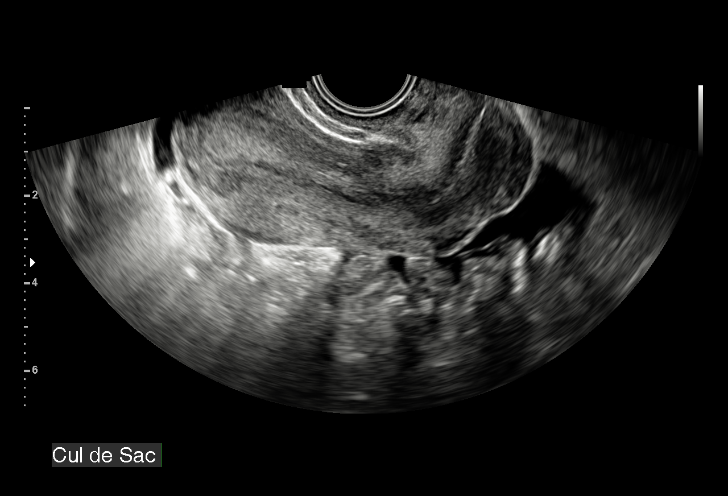
[im 45/45]
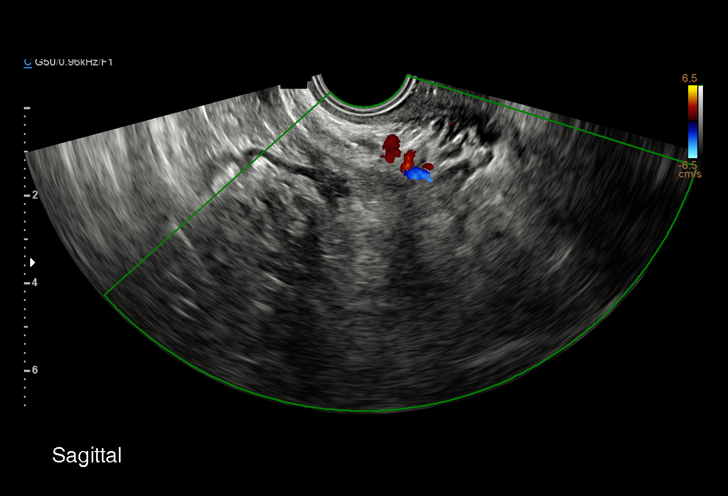

[15 of 28 positions shown; findings below may reference images not displayed]

FINDINGS: Intrauterine gestational sac: Negative. Endometrial stripe thickened
up to 14.4 mm.

Yolk sac:  Negative.

Embryo:  Negative.

Cardiac Activity: Negative.

Heart Rate: N/A  bpm

Subchorionic hemorrhage:  None visualized.

Maternal uterus/adnexae: Left ovary only seen transabdominally, and
is normal in appearance. No left adnexal mass. Small degenerating
corpus luteal cyst noted within the right ovary. Associated small
volume free fluid within the pelvis.
IMPRESSION: 1. Early pregnancy with no discrete IUP or adnexal mass identified.
Finding is consistent with a pregnancy of unknown anatomic location.
Differential considerations include IUP to early to visualize,
recent SAB, or possibly occult ectopic pregnancy. Close clinical
monitoring with serial beta HCGs and close interval follow-up
ultrasound recommended as clinically warranted.
2. Degenerating right ovarian corpus luteal cyst with associated
small volume free fluid within the pelvis.
3. No other acute maternal uterine or adnexal abnormality
identified.

## 2021-05-19 IMAGING — US US OB TRANSVAGINAL
2 series · 15 of 28 positions shown · non-contrast
Comparison: 10/13/2018

CLINICAL DATA: Pregnancy of unknown anatomic location

EXAM:
TRANSVAGINAL OB ULTRASOUND
TECHNIQUE: Transvaginal ultrasound was performed for complete evaluation of the
gestation as well as the maternal uterus, adnexal regions, and
pelvic cul-de-sac.

[Series 1: us ob transvaginal · 7 of 42 slices shown (1 of 2)]
[im 1/42]
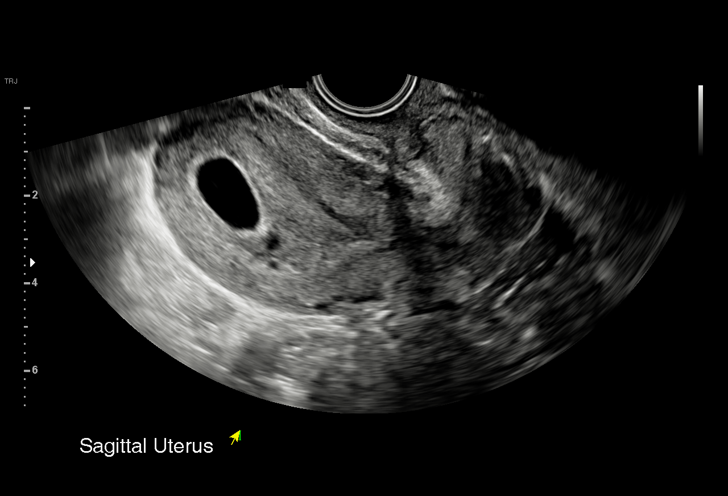
[im 7/42]
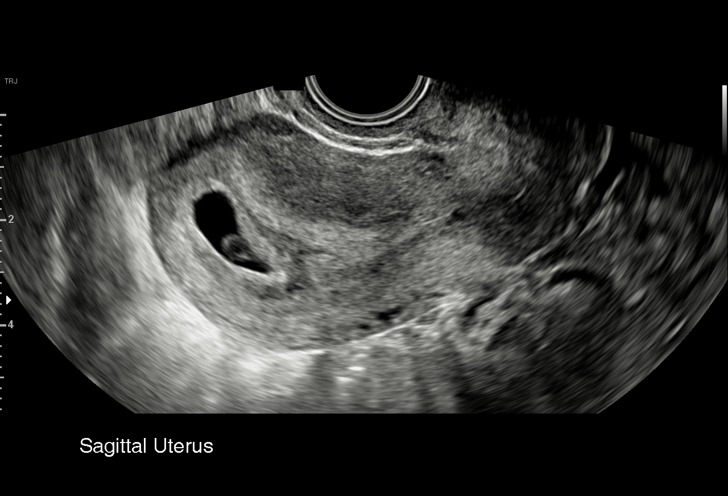
[im 13/42]
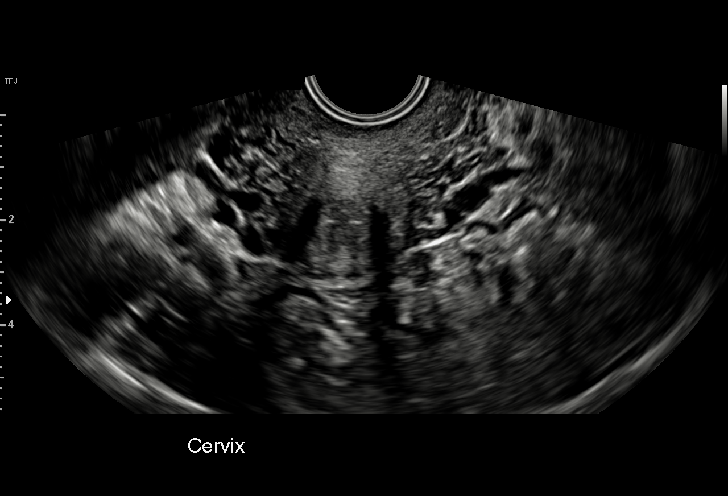
[im 19/42]
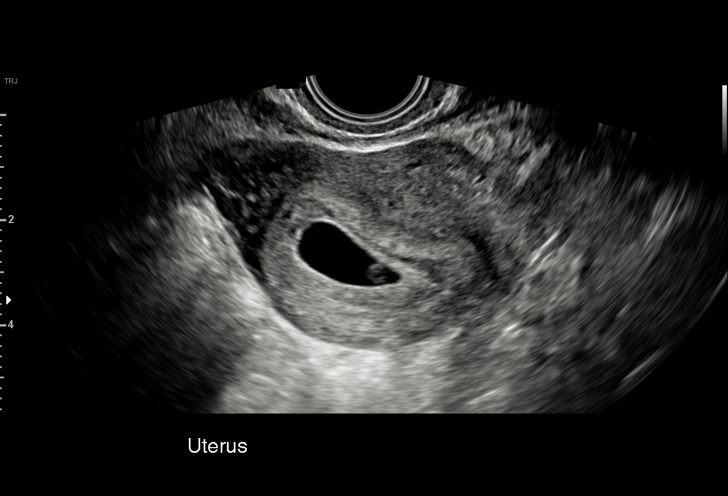
[im 26/42]
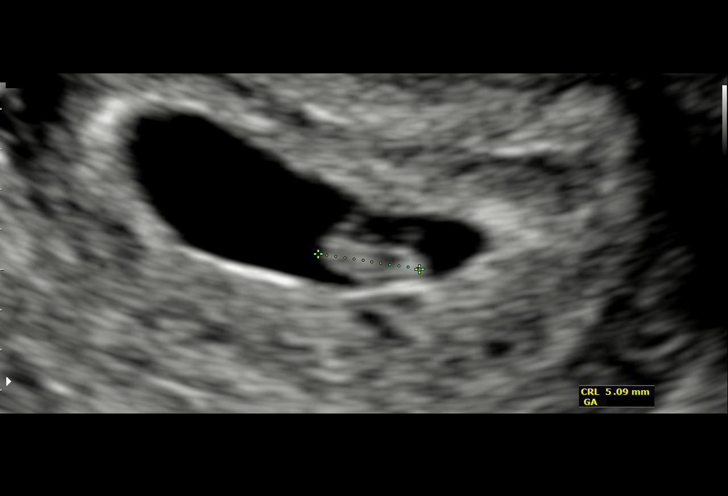
[im 32/42]
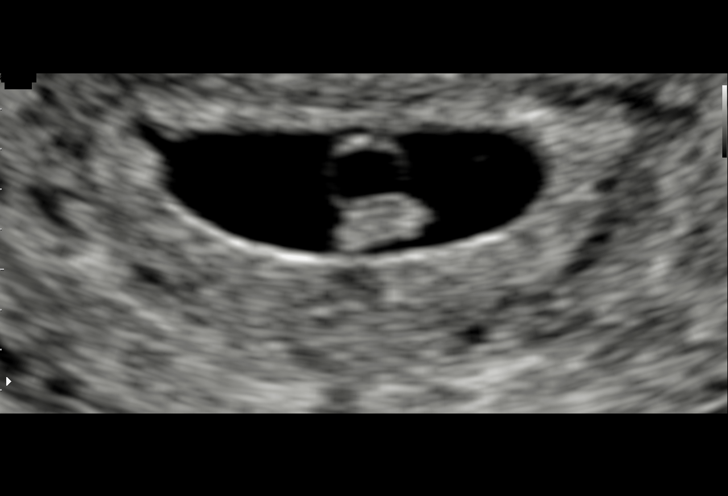
[im 38/42]
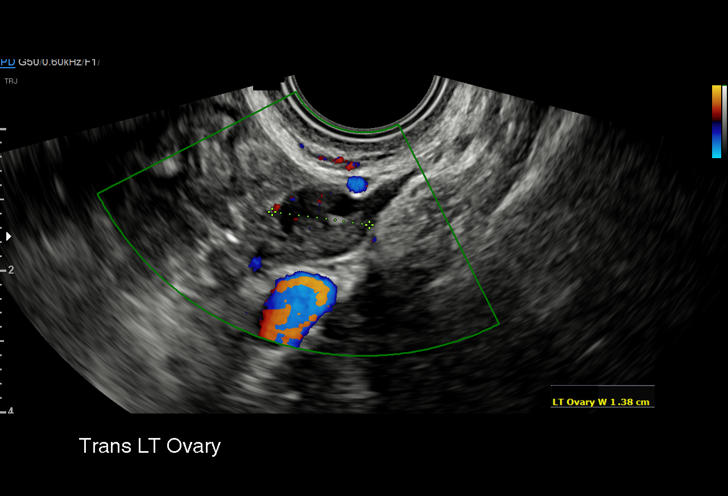

[Series 1: us ob transvaginal · 8 of 40 slices shown (2 of 2)]
[im 1/40]
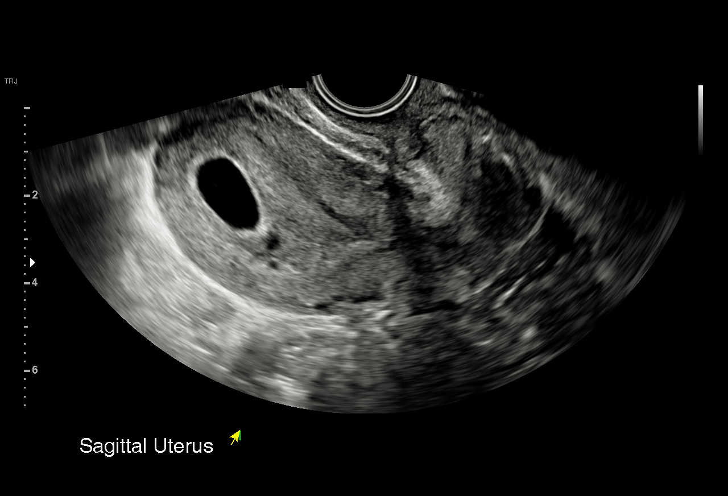
[im 4/40]
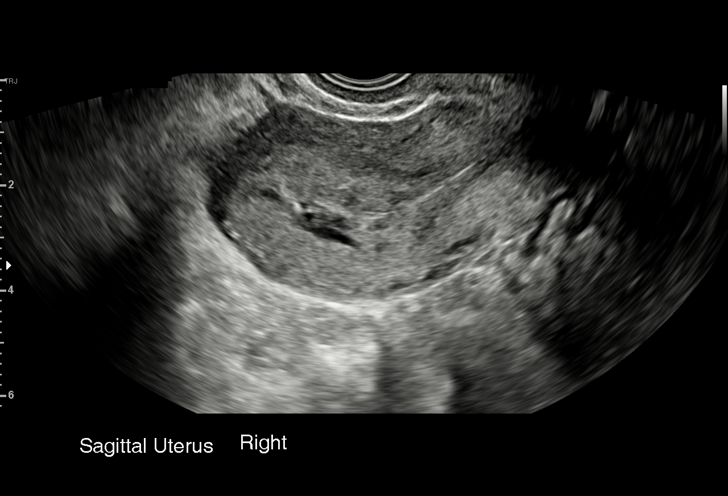
[im 10/40]
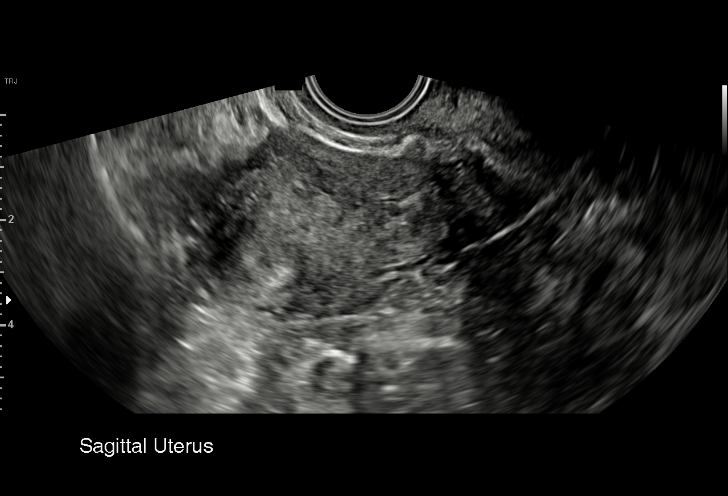
[im 16/40]
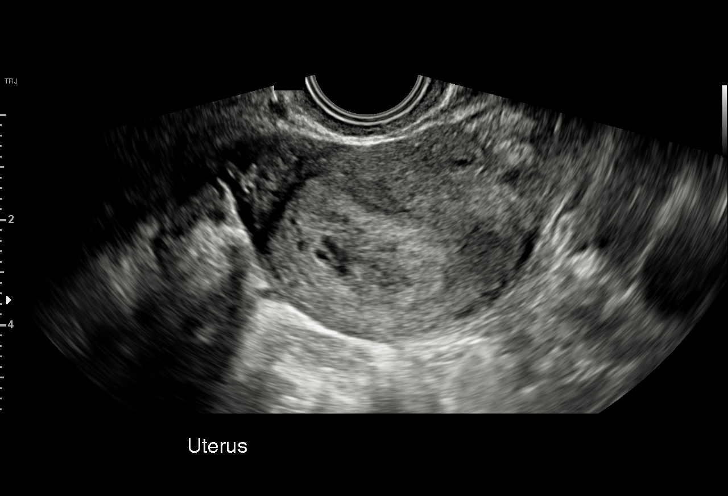
[im 22/40]
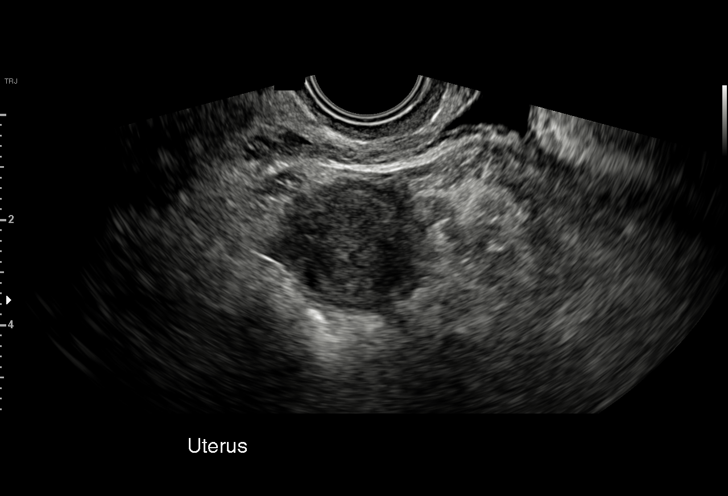
[im 28/40]
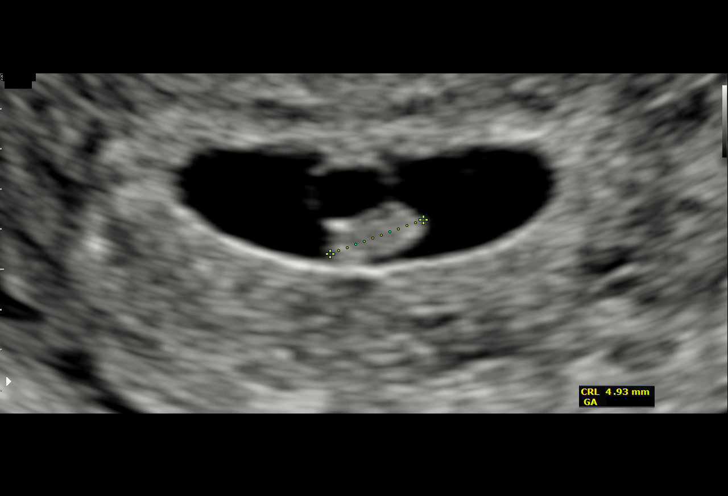
[im 34/40]
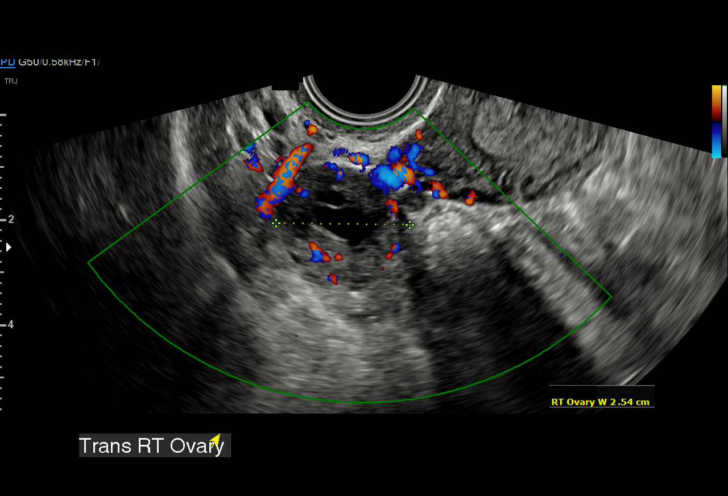
[im 40/40]
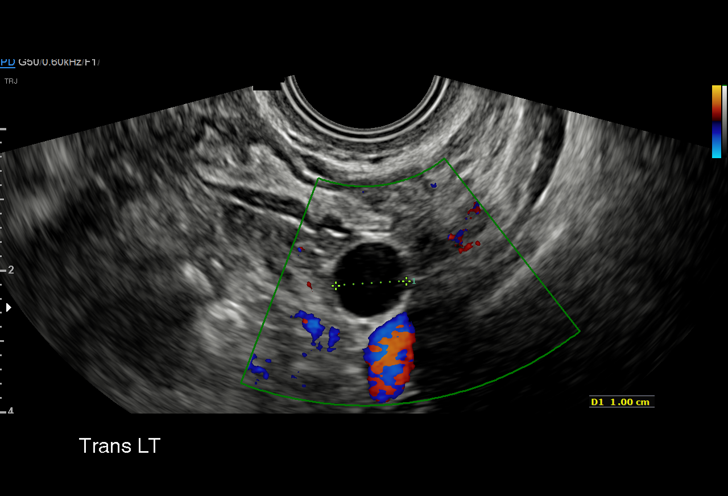

[15 of 28 positions shown; findings below may reference images not displayed]

FINDINGS: Intrauterine gestational sac: Present, single

Yolk sac:  Present

Embryo:  Present

Cardiac Activity: Present

Heart Rate: 112 bpm

CRL:   4.5 mm   6 w 1 d                  US EDC: 06/27/2019

Subchorionic hemorrhage:  None visualized.

Maternal uterus/adnexae:

RIGHT ovary measures 2.5 x 3.9 x 2.6 cm contains a small corpus
luteum.

LEFT ovary normal size and morphology, 1.4 x 2.9 x 1.0 cm.

Small amount of free pelvic fluid.

Small LEFT paraovarian cyst 11 x 10 x 10 mm.
IMPRESSION: Single live intrauterine gestation at 6 weeks 1 day EGA.

Small LEFT paraovarian cyst 11 mm greatest size.

Small amount of nonspecific free pelvic fluid.
